# Patient Record
Sex: Female | Born: 1960 | Race: White | Hispanic: No | Marital: Married | Smoking: Never smoker
Health system: Southern US, Community
[De-identification: ages and names within clinical notes are randomized; demographics above are authoritative.]

## PROBLEM LIST (undated history)

## (undated) HISTORY — PX: ABDOMINAL HYSTERECTOMY: SHX81

## (undated) HISTORY — PX: BACK SURGERY: SHX140

---

## 2005-06-12 ENCOUNTER — Inpatient Hospital Stay: Payer: Self-pay | Admitting: Obstetrics and Gynecology

## 2005-07-28 ENCOUNTER — Ambulatory Visit: Payer: Self-pay | Admitting: Obstetrics and Gynecology

## 2005-08-04 ENCOUNTER — Ambulatory Visit: Payer: Self-pay | Admitting: Obstetrics and Gynecology

## 2005-08-16 ENCOUNTER — Ambulatory Visit: Payer: Self-pay | Admitting: Gynecologic Oncology

## 2005-09-13 ENCOUNTER — Ambulatory Visit: Payer: Self-pay | Admitting: Gynecologic Oncology

## 2005-12-20 ENCOUNTER — Ambulatory Visit: Payer: Self-pay | Admitting: Gynecologic Oncology

## 2006-02-15 ENCOUNTER — Ambulatory Visit: Payer: Self-pay | Admitting: Gastroenterology

## 2006-04-05 ENCOUNTER — Ambulatory Visit: Payer: Self-pay | Admitting: Obstetrics and Gynecology

## 2006-10-09 ENCOUNTER — Ambulatory Visit: Payer: Self-pay | Admitting: Gynecologic Oncology

## 2007-01-17 ENCOUNTER — Emergency Department: Payer: Self-pay | Admitting: Emergency Medicine

## 2007-01-18 ENCOUNTER — Other Ambulatory Visit: Payer: Self-pay

## 2008-11-10 ENCOUNTER — Emergency Department: Payer: Self-pay | Admitting: Internal Medicine

## 2010-07-05 ENCOUNTER — Emergency Department: Payer: Self-pay | Admitting: Emergency Medicine

## 2011-02-28 ENCOUNTER — Emergency Department: Payer: Self-pay | Admitting: Emergency Medicine

## 2011-12-04 ENCOUNTER — Emergency Department: Payer: Self-pay | Admitting: Emergency Medicine

## 2011-12-04 LAB — BASIC METABOLIC PANEL
Anion Gap: 9 (ref 7–16)
BUN: 11 mg/dL (ref 7–18)
Calcium, Total: 9.4 mg/dL (ref 8.5–10.1)
Chloride: 105 mmol/L (ref 98–107)
Co2: 25 mmol/L (ref 21–32)
Creatinine: 0.84 mg/dL (ref 0.60–1.30)
EGFR (African American): >60
EGFR (Non-African Amer.): >60
Glucose: 93 mg/dL (ref 65–99)
Osmolality: 277 (ref 275–301)
Potassium: 4 mmol/L (ref 3.5–5.1)
Sodium: 139 mmol/L (ref 136–145)

## 2011-12-04 LAB — CK TOTAL AND CKMB (NOT AT ARMC)
CK, Total: 47 U/L (ref 21–215)
CK-MB: 0.8 ng/mL (ref 0.5–3.6)

## 2011-12-04 LAB — CBC
HCT: 41.1 % (ref 35.0–47.0)
HGB: 13.5 g/dL (ref 12.0–16.0)
MCH: 29.2 pg (ref 26.0–34.0)
MCHC: 32.8 g/dL (ref 32.0–36.0)
MCV: 89 fL (ref 80–100)
Platelet: 229 10*3/uL (ref 150–440)
RBC: 4.62 10*6/uL (ref 3.80–5.20)
RDW: 13.4 % (ref 11.5–14.5)
WBC: 6.9 10*3/uL (ref 3.6–11.0)

## 2011-12-04 LAB — TROPONIN I: Troponin-I: <0.02 ng/mL

## 2014-03-15 ENCOUNTER — Emergency Department: Payer: Self-pay | Admitting: Emergency Medicine

## 2019-04-08 ENCOUNTER — Encounter: Payer: Self-pay | Admitting: Emergency Medicine

## 2019-04-08 ENCOUNTER — Other Ambulatory Visit: Payer: Self-pay

## 2019-04-08 ENCOUNTER — Emergency Department: Payer: Self-pay

## 2019-04-08 ENCOUNTER — Emergency Department
Admission: EM | Admit: 2019-04-08 | Discharge: 2019-04-08 | Disposition: A | Discharge status: Home / Self Care | Payer: Self-pay | Attending: Emergency Medicine | Admitting: Emergency Medicine

## 2019-04-08 DIAGNOSIS — R0789 Other chest pain: Secondary | ICD-10-CM | POA: Insufficient documentation

## 2019-04-08 DIAGNOSIS — R1011 Right upper quadrant pain: Secondary | ICD-10-CM | POA: Insufficient documentation

## 2019-04-08 LAB — URINALYSIS, COMPLETE (UACMP) WITH MICROSCOPIC
Bilirubin Urine: NEGATIVE
Glucose, UA: NEGATIVE mg/dL
Hgb urine dipstick: NEGATIVE
Ketones, ur: NEGATIVE mg/dL
Leukocytes,Ua: NEGATIVE
Nitrite: NEGATIVE
Protein, ur: NEGATIVE mg/dL
Specific Gravity, Urine: 1.01 (ref 1.005–1.030)
pH: 6 (ref 5.0–8.0)

## 2019-04-08 LAB — BASIC METABOLIC PANEL
Anion gap: 12 (ref 5–15)
BUN: 12 mg/dL (ref 6–20)
CO2: 26 mmol/L (ref 22–32)
Calcium: 10.1 mg/dL (ref 8.9–10.3)
Chloride: 98 mmol/L (ref 98–111)
Creatinine, Ser: 0.93 mg/dL (ref 0.44–1.00)
GFR calc Af Amer: >60 mL/min (ref >60)
GFR calc non Af Amer: >60 mL/min (ref >60)
Glucose, Bld: 180 mg/dL — ABNORMAL HIGH (ref 70–99)
Potassium: 4.3 mmol/L (ref 3.5–5.1)
Sodium: 136 mmol/L (ref 135–145)

## 2019-04-08 LAB — CBC
HCT: 38.7 % (ref 36.0–46.0)
Hemoglobin: 12.9 g/dL (ref 12.0–15.0)
MCH: 29.6 pg (ref 26.0–34.0)
MCHC: 33.3 g/dL (ref 30.0–36.0)
MCV: 88.8 fL (ref 80.0–100.0)
Platelets: 292 10*3/uL (ref 150–400)
RBC: 4.36 MIL/uL (ref 3.87–5.11)
RDW: 12.5 % (ref 11.5–15.5)
WBC: 6.6 10*3/uL (ref 4.0–10.5)
nRBC: 0 % (ref 0.0–0.2)

## 2019-04-08 LAB — FIBRIN DERIVATIVES D-DIMER (ARMC ONLY): Fibrin derivatives D-dimer (ARMC): 541.42 ng/mL (FEU) — ABNORMAL HIGH (ref 0.00–499.00)

## 2019-04-08 LAB — TROPONIN I (HIGH SENSITIVITY)
Troponin I (High Sensitivity): 2 ng/L (ref <18)
Troponin I (High Sensitivity): <2 ng/L (ref <18)

## 2019-04-08 MED ORDER — FENTANYL CITRATE (PF) 100 MCG/2ML IJ SOLN
50.0000 ug | INTRAMUSCULAR | Status: DC | PRN
  Administered 2019-04-08: 50 ug via INTRAVENOUS
  Filled 2019-04-08: qty 2

## 2019-04-08 MED ORDER — MORPHINE SULFATE (PF) 4 MG/ML IV SOLN
4.0000 mg | Freq: Once | INTRAVENOUS | Status: AC
  Administered 2019-04-08: 16:00:00 4 mg via INTRAVENOUS
  Filled 2019-04-08: qty 1

## 2019-04-08 MED ORDER — IOHEXOL 350 MG/ML SOLN
75.0000 mL | Freq: Once | INTRAVENOUS | Status: AC | PRN
  Administered 2019-04-08: 18:00:00 75 mL via INTRAVENOUS

## 2019-04-08 MED ORDER — TRAMADOL HCL 50 MG PO TABS: 50.0000 mg | ORAL_TABLET | Freq: Four times a day (QID) | ORAL | 0 refills | Status: AC | PRN

## 2019-04-08 MED ORDER — SODIUM CHLORIDE 0.9% FLUSH: 3.0000 mL | Freq: Once | INTRAVENOUS | Status: DC

## 2019-04-08 NOTE — ED Provider Notes (Signed)
[facility] Emergency Department Provider Note ____________________________________________   First MD Initiated Contact with Patient 04/08/19 1444     (approximate)  I have reviewed the triage vital signs and the nursing notes.   HISTORY  Chief Complaint Chest Pain    HPI Lori Carlson is a 58 y.o. female with PMH as noted below who presents with right-sided chest and upper abdominal pain, acute onset yesterday and worsening today, and described as mostly sharp and coming in waves.  It radiates down into her right abdomen and towards her back.  She reports some associated shortness of breath as well as nausea but no vomiting.  She has no dysuria or hematuria.  She denies any leg pain or swelling.  No prior history of this pain.  The patient denies any prior abdominal surgeries.  History reviewed. No pertinent past medical history.  There are no active problems to display for this patient.   Past Surgical History:  Procedure Laterality Date  . ABDOMINAL HYSTERECTOMY    . BACK SURGERY      Prior to Admission medications   Medication Sig Start Date End Date Taking? Authorizing Provider  traMADol (ULTRAM) 50 MG tablet Take 1 tablet (50 mg total) by mouth every 6 (six) hours as needed. 04/08/19 04/07/20  Arta Silence, MD    Allergies Patient has no known allergies.  History reviewed. No pertinent family history.  Social History Social History   Tobacco Use  . Smoking status: Never Smoker  . Smokeless tobacco: Never Used  Substance Use Topics  . Alcohol use: Yes    Frequency: Never    Comment: occ.  . Drug use: Never    Review of Systems  Constitutional: No fever. Eyes: No redness. ENT: No sore throat. Cardiovascular: Positive for chest pain. Respiratory: Positive for shortness of breath. Gastrointestinal: No vomiting or diarrhea. Genitourinary: Negative for dysuria or hematuria.  Musculoskeletal: Positive for back pain. Skin:  Negative for rash. Neurological: Negative for headache.   ____________________________________________   PHYSICAL EXAM:  VITAL SIGNS: ED Triage Vitals  Enc Vitals Group     BP 04/08/19 1340 (!) 118/93     Pulse Rate 04/08/19 1340 98     Resp 04/08/19 1340 20     Temp 04/08/19 1340 98.4 F (36.9 C)     Temp Source 04/08/19 1340 Oral     SpO2 04/08/19 1340 100 %     Weight 04/08/19 1341 175 lb (79.4 kg)     Height 04/08/19 1341 5\' 7"  (1.702 m)     Head Circumference --      Peak Flow --      Pain Score 04/08/19 1341 10     Pain Loc --      Pain Edu? --      Excl. in [place]? --     Constitutional: Alert and oriented. Well appearing and in no acute distress. Eyes: Conjunctivae are normal.  Head: Atraumatic. Nose: No congestion/rhinnorhea. Mouth/Throat: Mucous membranes are slightly dry.   Neck: Normal range of motion.  Cardiovascular: Normal rate, regular rhythm. Good peripheral circulation. Respiratory: Normal respiratory effort.  No retractions.  Gastrointestinal: Soft and nontender. No distention.  Genitourinary: No flank or CVA tenderness. Musculoskeletal: No lower extremity edema.  No calf or popliteal swelling or tenderness.  Extremities warm and well perfused.  Neurologic:  Normal speech and language. No gross focal neurologic deficits are appreciated.  Skin:  Skin is warm and dry. No rash noted. Psychiatric: Mood and  affect are normal. Speech and behavior are normal.  ____________________________________________   LABS (all labs ordered are listed, but only abnormal results are displayed)  Labs Reviewed  BASIC METABOLIC PANEL - Abnormal; Notable for the following components:      Result Value   Glucose, Bld 180 (*)    All other components within normal limits  FIBRIN DERIVATIVES D-DIMER ([facility] ONLY) - Abnormal; Notable for the following components:   Fibrin derivatives D-dimer ([facility]) 541.42 (*)    All other components within normal limits  URINALYSIS, COMPLETE  (UACMP) WITH MICROSCOPIC - Abnormal; Notable for the following components:   Color, Urine AMBER (*)    APPearance CLEAR (*)    Bacteria, UA RARE (*)    All other components within normal limits  CBC  TROPONIN I (HIGH SENSITIVITY)  TROPONIN I (HIGH SENSITIVITY)   ____________________________________________  EKG  ED ECG REPORT I, Dionne Bucy, the attending physician, personally viewed and interpreted this ECG.  Date: 04/08/2019 EKG Time: 1334 Rate: 102 Rhythm: Sinus tachycardia QRS Axis: normal Intervals: normal ST/T Wave abnormalities: normal Narrative Interpretation: no evidence of acute ischemia  ____________________________________________  RADIOLOGY  CXR: No focal infiltrate or other acute abnormality US abdomen RUQ: No gallstones or other acute abnormality CT angio chest: No acute PE or other acute abnormality ____________________________________________   PROCEDURES  Procedure(s) performed: No  Procedures  Critical Care performed: No ____________________________________________   INITIAL IMPRESSION / ASSESSMENT AND PLAN / ED COURSE  Pertinent labs & imaging results that were available during my care of the patient were reviewed by me and considered in my medical decision making (see chart for details).  58 year old female with PMH as noted above presents with right-sided chest and upper abdominal pain since yesterday with some mild associated shortness of breath and some nausea but no vomiting.  She has no prior history of this pain.  On exam, the patient is relatively well-appearing.  Her vital signs are normal except that she was slightly tachycardic when she arrived.  She has no significant tenderness in the right upper quadrant or chest wall, and no increased work of breathing or respiratory distress.  EKG shows no ischemic changes.  Overall presentation is most consistent with biliary colic or other hepatobiliary cause, versus possible  gastritis or other GI etiology, or musculoskeletal pain.  Given her lack of risk factors and the highly atypical nature of the pain I have a low suspicion for ACS.  The patient has no specific PE risk factors or any signs or symptoms of DVT, however she feels PERC by her age and the tachycardia on arrival.  We will obtain lab work-up including troponins x2, D-dimer, and a chest x-ray and right upper quadrant ultrasound to further evaluate.  ----------------------------------------- 7:06 PM on 04/08/2019 -----------------------------------------  Right upper quadrant ultrasound showed no acute findings.  The repeat troponin is negative, but the D-dimer was slightly elevated.  Therefore I proceeded with a CT angio of the chest.  This was negative for acute PE.  The urinalysis is also negative.  The patient appears comfortable and has no active pain at this time.  Overall based on the results of the work-up I suspect musculoskeletal chest wall pain or other benign etiology.  At this time, the patient is stable for discharge home.  I counseled her on the results of the work-up.  I gave her thorough return precautions and she expressed understanding.  ____________________________________________   FINAL CLINICAL IMPRESSION(S) / ED DIAGNOSES  Final diagnoses:  Right  upper quadrant abdominal pain  Atypical chest pain      NEW MEDICATIONS STARTED DURING THIS VISIT:  New Prescriptions   TRAMADOL (ULTRAM) 50 MG TABLET    Take 1 tablet (50 mg total) by mouth every 6 (six) hours as needed.     Note:  This document was prepared using Dragon voice recognition software and may include unintentional dictation errors.    Dionne BucySiadecki, Mathew Storck, MD 04/08/19 1907

## 2019-04-08 NOTE — ED Notes (Signed)
Pt up to toilet 

## 2019-04-08 NOTE — ED Notes (Signed)
Sent urine to lab

## 2019-04-08 NOTE — ED Notes (Signed)
Patient transported to CT 

## 2019-04-08 NOTE — Discharge Instructions (Addendum)
Return to the ER for new, worsening, or persistent severe chest pain, shortness of breath, vomiting, fever, weakness, or any other new or worsening symptoms that concern you.  Follow-up with your regular doctor.

## 2019-04-08 NOTE — ED Notes (Signed)
Patient transported to X-ray 

## 2019-04-08 NOTE — ED Triage Notes (Signed)
Pt to ED from home c/o chest pain that started yesterday and describes as started as ache in the right side and intermittent severe sharp pain that radiates down right side and abd.  Some diarrhea yesterday, denies n/v, denies urinary changes.

## 2019-09-08 ENCOUNTER — Ambulatory Visit: Payer: Self-pay | Attending: Internal Medicine

## 2019-09-08 ENCOUNTER — Other Ambulatory Visit: Payer: Self-pay

## 2019-09-08 DIAGNOSIS — Z23 Encounter for immunization: Secondary | ICD-10-CM

## 2019-09-08 NOTE — Progress Notes (Signed)
   Covid-19 Vaccination Clinic  Name:  Lori Carlson    MRN: [id number] DOB: 10-09-1960  09/08/2019  [name] was observed post Covid-19 immunization for 15 minutes without incident. She was provided with Vaccine Information Sheet and instruction to access the V-Safe system.   [name] was instructed to call 911 with any severe reactions post vaccine: Marland Kitchen Difficulty breathing  . Swelling of face and throat  . A fast heartbeat  . A bad rash all over body  . Dizziness and weakness   Immunizations Administered    Name Date Dose VIS Date Route   Pfizer COVID-19 Vaccine 09/08/2019  9:33 AM 0.3 mL 05/16/2019 Intramuscular   Manufacturer: [employer], [employer]   Lot: [id number]   NDC: [address]-1

## 2019-10-01 ENCOUNTER — Ambulatory Visit: Payer: Self-pay

## 2019-10-07 ENCOUNTER — Ambulatory Visit: Payer: Self-pay | Attending: Internal Medicine

## 2019-10-07 DIAGNOSIS — Z23 Encounter for immunization: Secondary | ICD-10-CM

## 2019-10-07 NOTE — Progress Notes (Signed)
   Covid-19 Vaccination Clinic  Name:  Lori Carlson    MRN: [id number] DOB: March 10, 1961  10/07/2019  [name] was observed post Covid-19 immunization for 15 minutes without incident. She was provided with Vaccine Information Sheet and instruction to access the V-Safe system.   [name] was instructed to call 911 with any severe reactions post vaccine: Marland Kitchen Difficulty breathing  . Swelling of face and throat  . A fast heartbeat  . A bad rash all over body  . Dizziness and weakness   Immunizations Administered    Name Date Dose VIS Date Route   Pfizer COVID-19 Vaccine 10/07/2019 11:35 AM 0.3 mL 07/30/2018 Intramuscular   Manufacturer: [employer], [employer]   Lot: [id number]   NDC: [address]-6

## 2021-02-22 IMAGING — CT CT ANGIO CHEST
2 of 6 series · 18 of 46 positions shown · IV contrast (APPLIED)
Comparison: Chest x-ray 04/08/2019

CLINICAL DATA: Chest pain

EXAM:
CT ANGIOGRAPHY CHEST WITH CONTRAST
TECHNIQUE: Multidetector CT imaging of the chest was performed using the
standard protocol during bolus administration of intravenous
contrast. Multiplanar CT image reconstructions and MIPs were
obtained to evaluate the vascular anatomy.
CONTRAST:  75mL OMNIPAQUE IOHEXOL 350 MG/ML SOLN

[Series 5: thins · axial · 0.70mm/px · z∈[-518,-296]mm · 16 of 244 slices shown]
[im 11/244  lung]
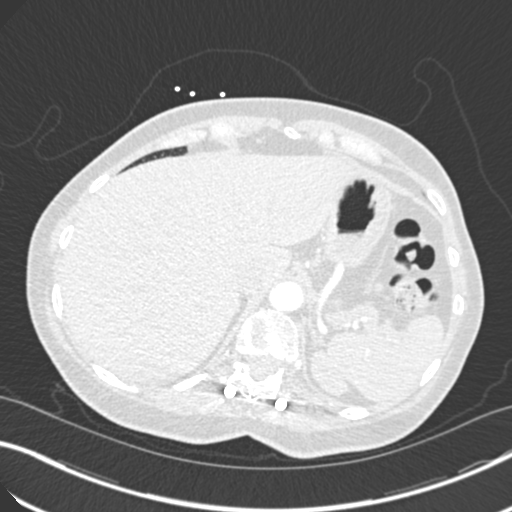
[im 32/244  soft-tissue]
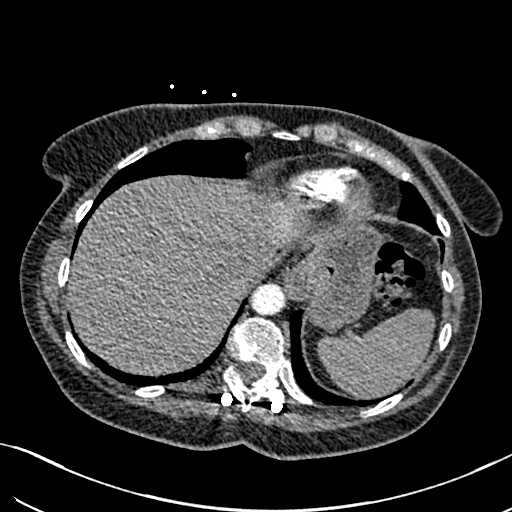
[im 43/244  lung]
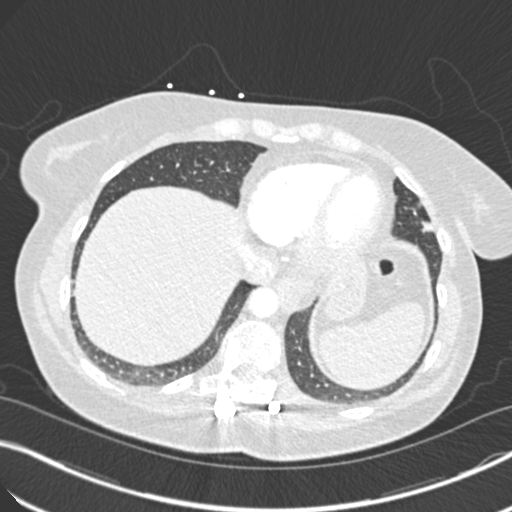
[im 53/244  soft-tissue]
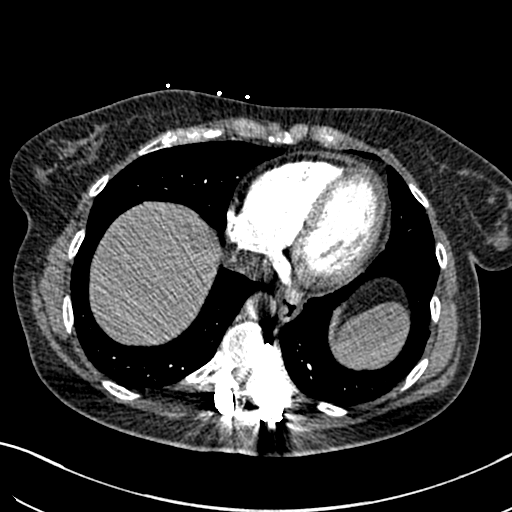
[im 74/244  lung]
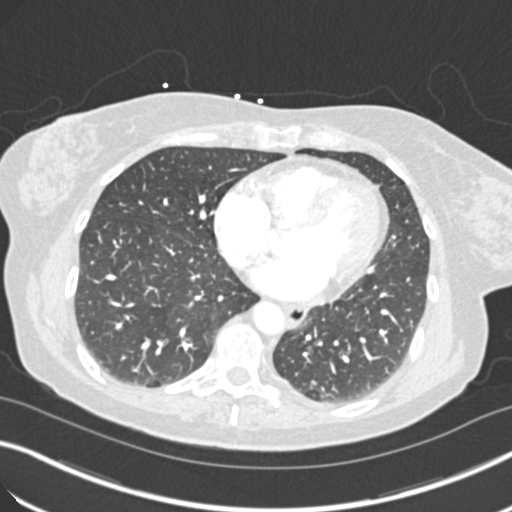
[im 85/244  soft-tissue]
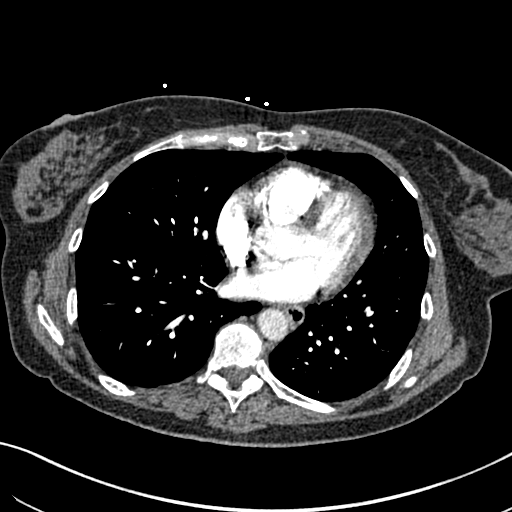
[im 96/244  lung]
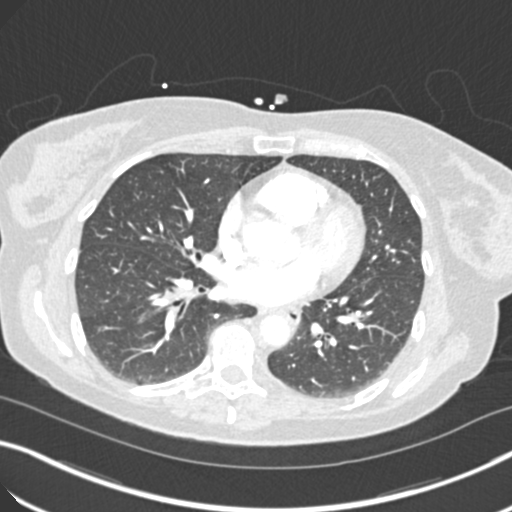
[im 117/244  soft-tissue]
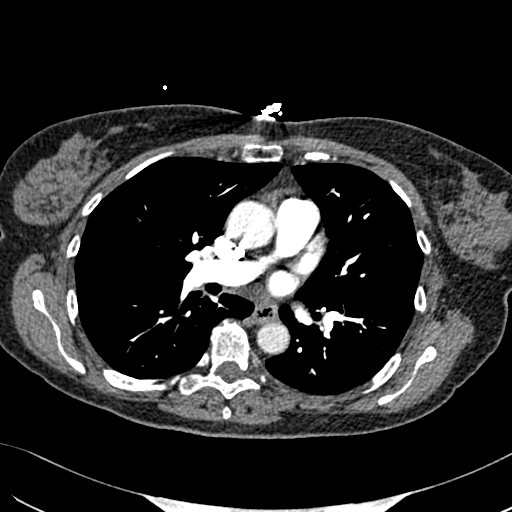
[im 127/244  lung]
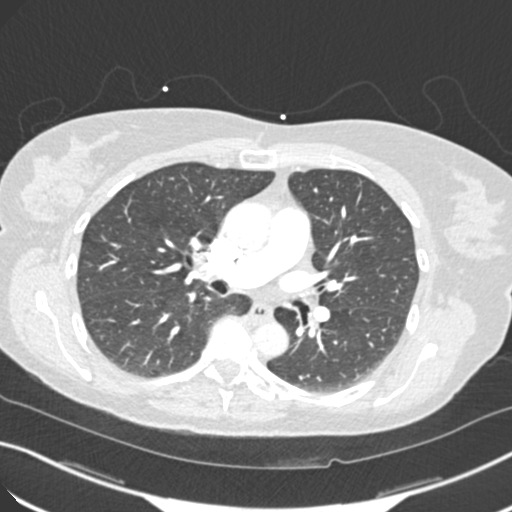
[im 148/244  soft-tissue]
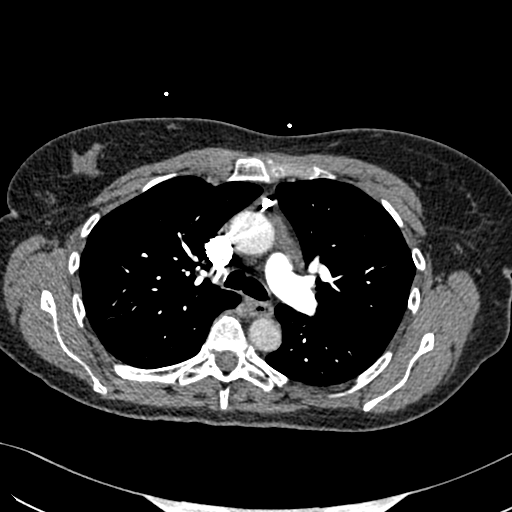
[im 159/244  lung]
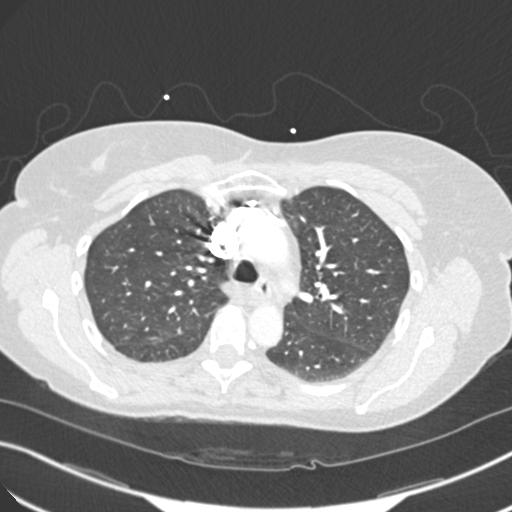
[im 170/244  soft-tissue]
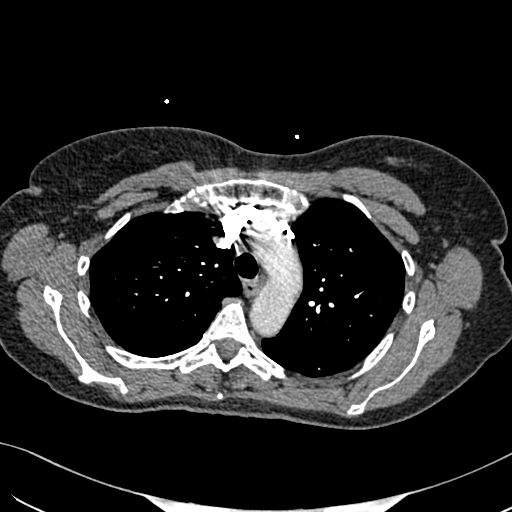
[im 191/244  lung]
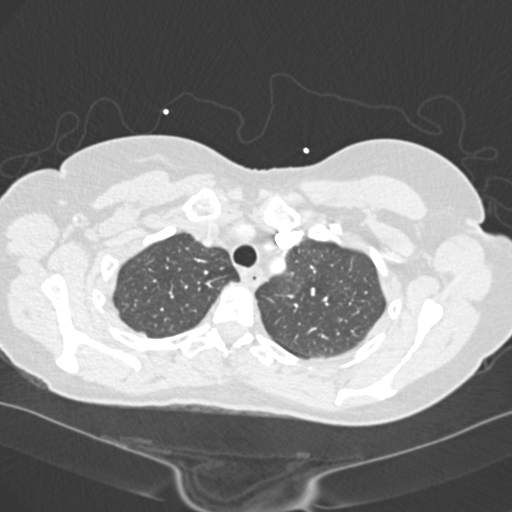
[im 201/244  soft-tissue]
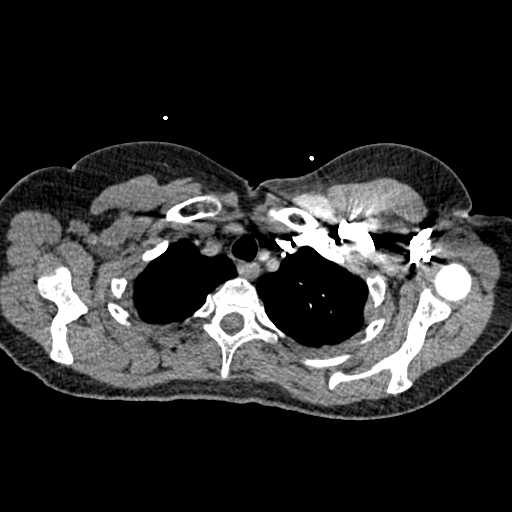
[im 212/244  lung]
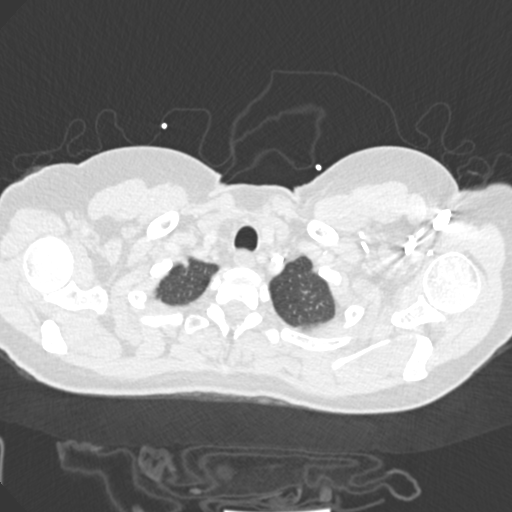
[im 233/244  soft-tissue]
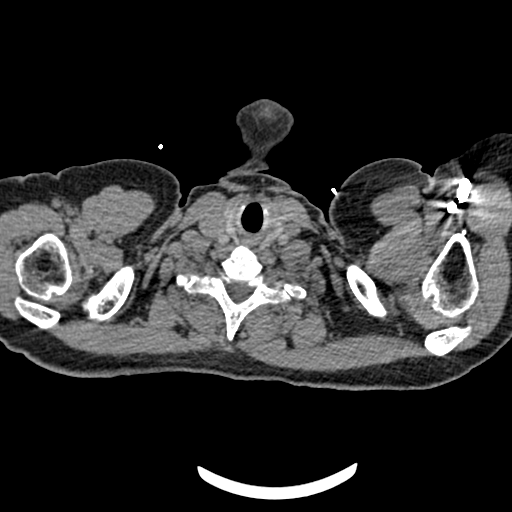

[Series 7: coronal mpr · coronal · 0.51mm/px · 2 of 104 slices shown]
[im 35/104  soft-tissue]
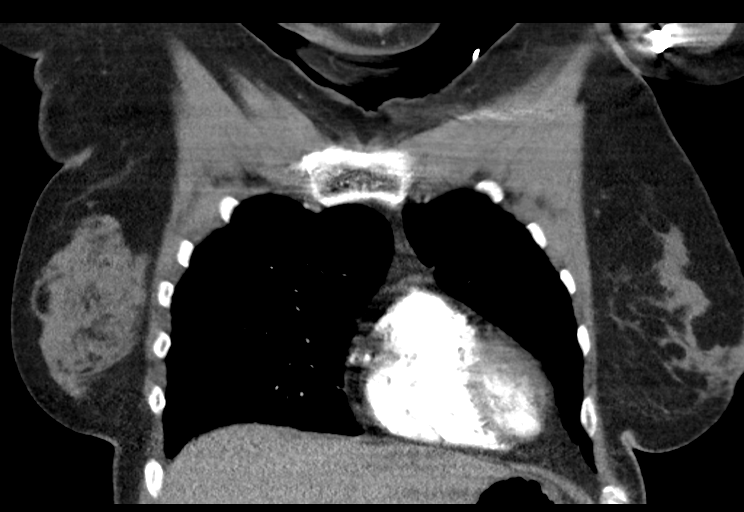
[im 69/104  soft-tissue]
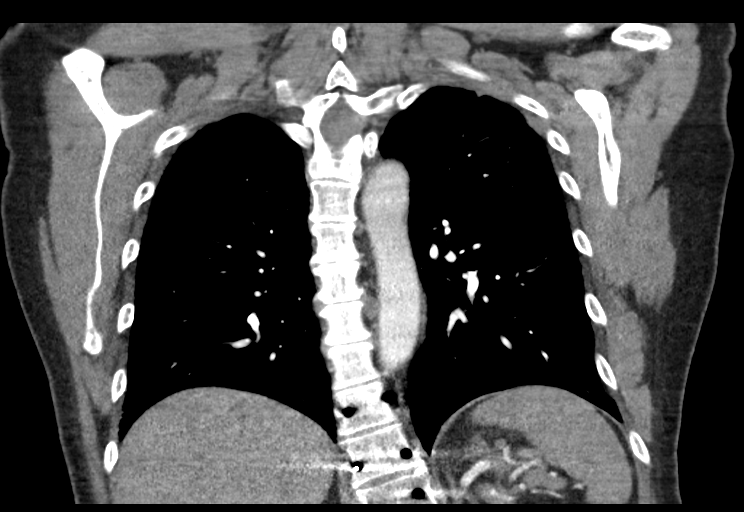

[18 of 46 positions shown; findings below may reference images not displayed]

FINDINGS: Cardiovascular: Satisfactory opacification of the pulmonary arteries
to the segmental level. No evidence of pulmonary embolism. Normal
heart size. No pericardial effusion. Nonaneurysmal aorta. Mild
aortic atherosclerosis. Small enhancing veins within the prevascular
space.

Mediastinum/Nodes: No enlarged mediastinal, hilar, or axillary lymph
nodes. Thyroid gland, trachea, and esophagus demonstrate no
significant findings.

Lungs/Pleura: Lungs are clear. No pleural effusion or pneumothorax.

Upper Abdomen: No acute abnormality.

Musculoskeletal: Posterior stabilization rods and fixating screws in
the spine. No acute or suspicious osseous abnormality

Review of the MIP images confirms the above findings.
IMPRESSION: 1. Negative for acute pulmonary embolus.
2. Clear lung fields

Aortic Atherosclerosis ([id number].A).

## 2022-04-27 ENCOUNTER — Other Ambulatory Visit: Payer: Self-pay

## 2022-04-27 ENCOUNTER — Emergency Department: Payer: Self-pay

## 2022-04-27 DIAGNOSIS — K209 Esophagitis, unspecified without bleeding: Secondary | ICD-10-CM | POA: Insufficient documentation

## 2022-04-27 DIAGNOSIS — K2900 Acute gastritis without bleeding: Secondary | ICD-10-CM | POA: Insufficient documentation

## 2022-04-27 DIAGNOSIS — Z1152 Encounter for screening for COVID-19: Secondary | ICD-10-CM | POA: Insufficient documentation

## 2022-04-27 LAB — URINALYSIS, ROUTINE W REFLEX MICROSCOPIC
Bacteria, UA: NONE SEEN
Bilirubin Urine: NEGATIVE
Glucose, UA: NEGATIVE mg/dL
Ketones, ur: 20 mg/dL — AB
Nitrite: NEGATIVE
Protein, ur: 100 mg/dL — AB
Specific Gravity, Urine: 1.029 (ref 1.005–1.030)
pH: 5 (ref 5.0–8.0)

## 2022-04-27 LAB — COMPREHENSIVE METABOLIC PANEL
ALT: 14 U/L (ref 0–44)
AST: 20 U/L (ref 15–41)
Albumin: 4.8 g/dL (ref 3.5–5.0)
Alkaline Phosphatase: 82 U/L (ref 38–126)
Anion gap: 11 (ref 5–15)
BUN: 14 mg/dL (ref 6–20)
CO2: 27 mmol/L (ref 22–32)
Calcium: 10.4 mg/dL — ABNORMAL HIGH (ref 8.9–10.3)
Chloride: 100 mmol/L (ref 98–111)
Creatinine, Ser: 0.8 mg/dL (ref 0.44–1.00)
GFR, Estimated: >60 mL/min (ref >60)
Glucose, Bld: 153 mg/dL — ABNORMAL HIGH (ref 70–99)
Potassium: 3.7 mmol/L (ref 3.5–5.1)
Sodium: 138 mmol/L (ref 135–145)
Total Bilirubin: 0.9 mg/dL (ref 0.3–1.2)
Total Protein: 8.6 g/dL — ABNORMAL HIGH (ref 6.5–8.1)

## 2022-04-27 LAB — CBC
HCT: 44.4 % (ref 36.0–46.0)
Hemoglobin: 14.9 g/dL (ref 12.0–15.0)
MCH: 28.7 pg (ref 26.0–34.0)
MCHC: 33.6 g/dL (ref 30.0–36.0)
MCV: 85.4 fL (ref 80.0–100.0)
Platelets: 345 10*3/uL (ref 150–400)
RBC: 5.2 MIL/uL — ABNORMAL HIGH (ref 3.87–5.11)
RDW: 12.6 % (ref 11.5–15.5)
WBC: 9.6 10*3/uL (ref 4.0–10.5)
nRBC: 0 % (ref 0.0–0.2)

## 2022-04-27 LAB — RESP PANEL BY RT-PCR (FLU A&B, COVID) ARPGX2
Influenza A by PCR: NEGATIVE
Influenza B by PCR: NEGATIVE
SARS Coronavirus 2 by RT PCR: NEGATIVE

## 2022-04-27 LAB — LIPASE, BLOOD: Lipase: 27 U/L (ref 11–51)

## 2022-04-27 LAB — TROPONIN I (HIGH SENSITIVITY): Troponin I (High Sensitivity): 4 ng/L (ref <18)

## 2022-04-27 MED ORDER — ONDANSETRON 4 MG PO TBDP
4.0000 mg | ORAL_TABLET | Freq: Once | ORAL | Status: AC | PRN
  Administered 2022-04-27: 4 mg via ORAL
  Filled 2022-04-27: qty 1

## 2022-04-27 NOTE — ED Notes (Signed)
First Nurse Note: Pt via EMS from home with complaints of N/V since Tuesday. Dry heaving with EMS - no vomiting today. Hx of norovirus 10 years ago and states it feels the same today.   VS with EMS 166/90 92

## 2022-04-27 NOTE — ED Triage Notes (Signed)
Reports pressure like central chest pain with radiation through to back. Reports onset after n/v since Tuesday. Denies abd pain. Pt tearful in triage and having difficulty answering questions. Reports h/a. Denies dizziness. Reports syncopal event at home. Pt alert and oriented. Pt speaking in full sentences.

## 2022-04-28 ENCOUNTER — Emergency Department: Payer: Self-pay

## 2022-04-28 ENCOUNTER — Emergency Department
Admission: EM | Admit: 2022-04-28 | Discharge: 2022-04-28 | Disposition: A | Discharge status: Home / Self Care | Payer: Self-pay | Attending: Emergency Medicine | Admitting: Emergency Medicine

## 2022-04-28 DIAGNOSIS — K2900 Acute gastritis without bleeding: Secondary | ICD-10-CM

## 2022-04-28 DIAGNOSIS — K209 Esophagitis, unspecified without bleeding: Secondary | ICD-10-CM

## 2022-04-28 LAB — TROPONIN I (HIGH SENSITIVITY): Troponin I (High Sensitivity): 3 ng/L (ref <18)

## 2022-04-28 MED ORDER — IOHEXOL 300 MG/ML  SOLN
100.0000 mL | Freq: Once | INTRAMUSCULAR | Status: AC | PRN
  Administered 2022-04-28: 100 mL via INTRAVENOUS

## 2022-04-28 MED ORDER — LACTATED RINGERS IV BOLUS
1000.0000 mL | Freq: Once | INTRAVENOUS | Status: AC
  Administered 2022-04-28: 1000 mL via INTRAVENOUS

## 2022-04-28 MED ORDER — SUCRALFATE 1 G PO TABS: 1.0000 g | ORAL_TABLET | Freq: Four times a day (QID) | ORAL | 1 refills | Status: AC | PRN

## 2022-04-28 MED ORDER — ONDANSETRON 4 MG PO TBDP: ORAL_TABLET | ORAL | 0 refills | Status: AC

## 2022-04-28 MED ORDER — OMEPRAZOLE MAGNESIUM 20 MG PO TBEC: 20.0000 mg | DELAYED_RELEASE_TABLET | Freq: Every day | ORAL | 1 refills | Status: AC

## 2022-04-28 MED ORDER — DROPERIDOL 2.5 MG/ML IJ SOLN
2.5000 mg | Freq: Once | INTRAMUSCULAR | Status: AC
  Administered 2022-04-28: 2.5 mg via INTRAVENOUS
  Filled 2022-04-28: qty 2

## 2022-04-28 MED ORDER — PANTOPRAZOLE SODIUM 40 MG IV SOLR
40.0000 mg | Freq: Once | INTRAVENOUS | Status: AC
  Administered 2022-04-28: 40 mg via INTRAVENOUS
  Filled 2022-04-28: qty 10

## 2022-04-28 NOTE — ED Notes (Signed)
Patient discharged at this time. Wheeled to lobby per pt request. Breathing unlabored speaking in full sentences. Verbalized understanding of all discharge, follow up, and medication teaching. Discharged homed with all belongings.   

## 2022-04-28 NOTE — ED Notes (Signed)
Pt reports improvement in nausea and pain following medication administration

## 2022-04-28 NOTE — ED Notes (Signed)
Patient transported to CT 

## 2022-04-28 NOTE — ED Provider Notes (Signed)
[facility] Provider Note    Event Date/Time   First MD Initiated Contact with Patient 04/28/22 0252     (approximate)   History   Chest Pain and Emesis   HPI  Level 5 caveat: History is somewhat limited by the patient not being able to provide many details.  Lori Carlson is a 61 y.o. female who presents for evaluation of persistent vomiting for the last 3+ days.  She is unable to give me any detail except that she started throwing up around lunchtime (before eating lunch) and has not been able to stop for the last 3+ days.  She said that she has thrown up so much that now her chest and her stomach or sore, particularly when she vomits, but she said that otherwise she is not having any chest pain or pressure nor abdominal pain.  She denies having any diarrhea.  No mention of passing flatus.  She denies any recent fever and shortness of breath.  She has never had symptoms like this before.  No one around her has been ill.     Physical Exam   Triage Vital Signs: ED Triage Vitals  Enc Vitals Group     BP 04/27/22 2024 118/76     Pulse Rate 04/27/22 2023 97     Resp 04/27/22 2023 18     Temp 04/27/22 2032 98.4 F (36.9 C)     Temp Source 04/27/22 2023 Oral     SpO2 04/27/22 2023 99 %     Weight 04/27/22 2024 83.9 kg (185 lb)     Height 04/27/22 2024 1.702 m (5\' 7" )     Head Circumference --      Peak Flow --      Pain Score 04/27/22 2024 10     Pain Loc --      Pain Edu? --      Excl. in [place]? --     Most recent vital signs: Vitals:   04/28/22 0400 04/28/22 0535  BP: 117/68 136/78  Pulse: 79 78  Resp: 20 16  Temp:    SpO2: (S) (!) 79% 100%     General: Awake, alert. CV:  Good peripheral perfusion.  Normal heart sounds. Resp:  Normal effort.  Lungs are clear to auscultation. Abd:  No distention.  No tenderness to palpation. Other:  Moving all 4 extremities, no focal neurological deficits.  The patient is very upset and anxious, sobbing,  but denying any pain, just very upset about vomiting for several days.   ED Results / Procedures / Treatments   Labs (all labs ordered are listed, but only abnormal results are displayed) Labs Reviewed  COMPREHENSIVE METABOLIC PANEL - Abnormal; Notable for the following components:      Result Value   Glucose, Bld 153 (*)    Calcium 10.4 (*)    Total Protein 8.6 (*)    All other components within normal limits  CBC - Abnormal; Notable for the following components:   RBC 5.20 (*)    All other components within normal limits  URINALYSIS, ROUTINE W REFLEX MICROSCOPIC - Abnormal; Notable for the following components:   Color, Urine AMBER (*)    APPearance CLOUDY (*)    Hgb urine dipstick SMALL (*)    Ketones, ur 20 (*)    Protein, ur 100 (*)    Leukocytes,Ua MODERATE (*)    All other components within normal limits  RESP PANEL BY RT-PCR (FLU A&B, COVID) ARPGX2  URINE CULTURE  LIPASE, BLOOD  TROPONIN I (HIGH SENSITIVITY)  TROPONIN I (HIGH SENSITIVITY)     EKG  ED ECG REPORT I, Loleta Rose, the attending physician, personally viewed and interpreted this ECG.  Date: 04/27/2022 EKG Time: 20:25 Rate: 82 Rhythm: normal sinus rhythm QRS Axis: normal Intervals: normal ST/T Wave abnormalities: normal Narrative Interpretation: no evidence of acute ischemia    RADIOLOGY I viewed and interpreted the patient's CXR and there is no evidence of acute abnormality like pneumonia or evidence of free air from a perforated esophagus.  I also read the radiologist's report, which confirmed no acute findings.  I also viewed and interpreted the patient's CT of the abdomen and pelvis.  See hospital course for details, but the patient has some esophagitis and a small hiatal hernia, no other acute abnormality.    PROCEDURES:  Critical Care performed: No  .1-3 Lead EKG Interpretation  Performed by: Loleta Rose, MD Authorized by: Loleta Rose, MD     Interpretation: normal     ECG  rate:  60   ECG rate assessment: normal     Rhythm: sinus rhythm     Ectopy: none     Conduction: normal      MEDICATIONS ORDERED IN ED: Medications  ondansetron (ZOFRAN-ODT) disintegrating tablet 4 mg (4 mg Oral Given 04/27/22 2031)  droperidol (INAPSINE) 2.5 MG/ML injection 2.5 mg (2.5 mg Intravenous Given 04/28/22 0316)  lactated ringers bolus 1,000 mL (0 mLs Intravenous Stopped 04/28/22 0542)  iohexol (OMNIPAQUE) 300 MG/ML solution 100 mL (100 mLs Intravenous Contrast Given 04/28/22 0428)  pantoprazole (PROTONIX) injection 40 mg (40 mg Intravenous Given 04/28/22 0541)     IMPRESSION / MDM / ASSESSMENT AND PLAN / ED COURSE  I reviewed the triage vital signs and the nursing notes.                              Differential diagnosis includes, but is not limited to, gastritis, biliary colic, foodborne pathogen, atypical ACS presentation, AAS, SBO/ileus.  Patient's presentation is most consistent with acute presentation with potential threat to life or bodily function.  Vital signs are stable and essentially normal other than some mild tachypnea which seems to be related to her degree of anxiety over her symptoms.  Labs/studies ordered: EKG, cardiac monitoring, CBC, CMP, lipase, urinalysis, respiratory viral panel, high-sensitivity troponin x2, urine culture.  The patient is on the cardiac monitor to evaluate for evidence of arrhythmia and/or significant heart rate changes.  This does not appear to be an ACS presentation and AAS is very unlikely as well.  This seems to be strictly a gastritis given that she is not even reporting abdominal pain and has no tenderness to palpation.  However her degree of upset and response to her symptoms seems out of proportion with the history.  Given her age and the degree of distress she seems to be in and spite of normal vital signs including no tachycardia, I will further evaluate by ordering a CT of the abdomen pelvis.  Her initial labs are  generally reassuring.  She may have a urinary tract infection but it should not explain this degree of gastritis.  In spite of reporting that she has not been able to eat or drink anything for several days, she has no significant electrolyte abnormalities, which is reassuring.  She also has no leukocytosis, no renal dysfunction, no LFT elevation, no elevation of her lipase.  I think  it is unlikely the CT scan will reveal an SBO or ileus but we will evaluate.  Chest x-ray ordered at triage also shows no evidence of acute intrathoracic abnormality.  For her persistent nausea and vomiting as well as to act as a calming agent, I ordered droperidol 2.5 mg IV and 1 L LR IV bolus.  Clinical Course as of 04/28/22 0548  Fri Apr 28, 2022  0410 Troponin I (High Sensitivity): 3 [CF]  [phone] CT ABDOMEN PELVIS W CONTRAST I viewed and interpreted the patient's CT of the abdomen pelvis I cannot see any acute abnormality such as obstruction or ileus.  However the radiologist commented on inflammation and a small hiatal hernia that suggests esophagitis.  This makes sense clinically, so I ordered pantoprazole 40 mg IV. [CF]  0543 Troponin I (High Sensitivity): 3 [CF]  0543 I reassessed the patient.  She feels much better.  She is now calm, drowsy but awake and alert, and says that she feels much better.  She is comfortable with the plan to go home.  I went over the results of her scan with her and her sister, including the small hiatal hernia and esophagitis.  I recommended PPI, Carafate, Zofran, and outpatient follow-up with GI.  I will give follow-up information with local GI (Dr. Tobi Bastos), but she said that she follows up in [facility] and will seek evaluation there.  She has moderate leukocytes and WBCs but no bacteria seen in her urine and she is reporting no dysuria.  I will not treat empirically but a urine culture is pending. [CF]    Clinical Course User Index [CF] Loleta Rose, MD     FINAL CLINICAL  IMPRESSION(S) / ED DIAGNOSES   Final diagnoses:  Acute gastritis without hemorrhage, unspecified gastritis type  Esophagitis     Rx / DC Orders   ED Discharge Orders          Ordered    ondansetron (ZOFRAN-ODT) 4 MG disintegrating tablet        04/28/22 0547    sucralfate (CARAFATE) 1 g tablet  4 times daily PRN        04/28/22 0547    omeprazole (PRILOSEC OTC) 20 MG tablet  Daily        04/28/22 0547             Note:  This document was prepared using Dragon voice recognition software and may include unintentional dictation errors.   Loleta Rose, MD 04/28/22 [phone]

## 2022-04-28 NOTE — ED Notes (Signed)
Patient returned from CT

## 2022-04-29 LAB — URINE CULTURE: Culture: >=100000 — AB
